# Patient Record
Sex: Female | Born: 2000 | Race: White | Hispanic: No | Marital: Single
Health system: Southern US, Community
[De-identification: ages and names within clinical notes are randomized; demographics above are authoritative.]

---
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
## 2022-02-07 IMAGING — DX DG KNEE COMPLETE 4+V*L*
5 series · 5 of 5 positions shown · non-contrast
Comparison: None.

CLINICAL DATA: Pt presents with left knee pain. States during
lacrosse game yesterday felt pain and now unable to put much
pressure on it to walk

EXAM:
LEFT KNEE - COMPLETE 4+ VIEW

[knee ap]
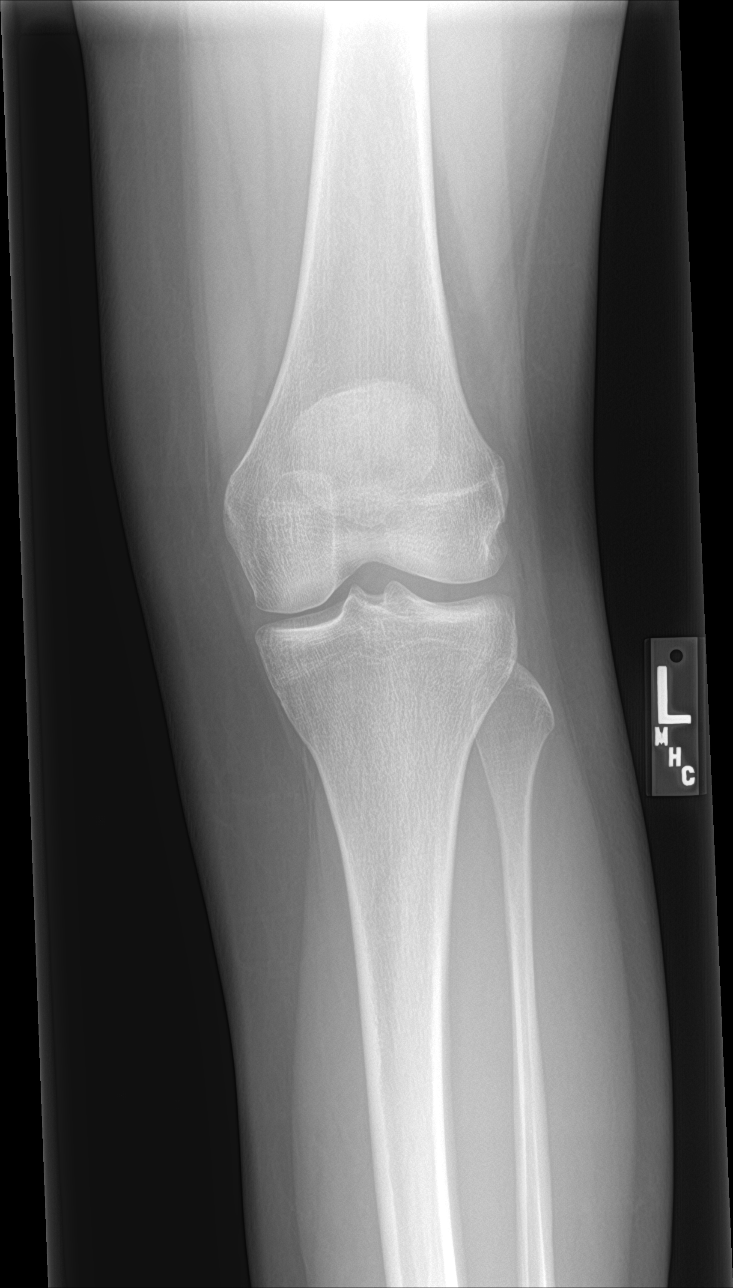

[knee obl (1 of 2)]
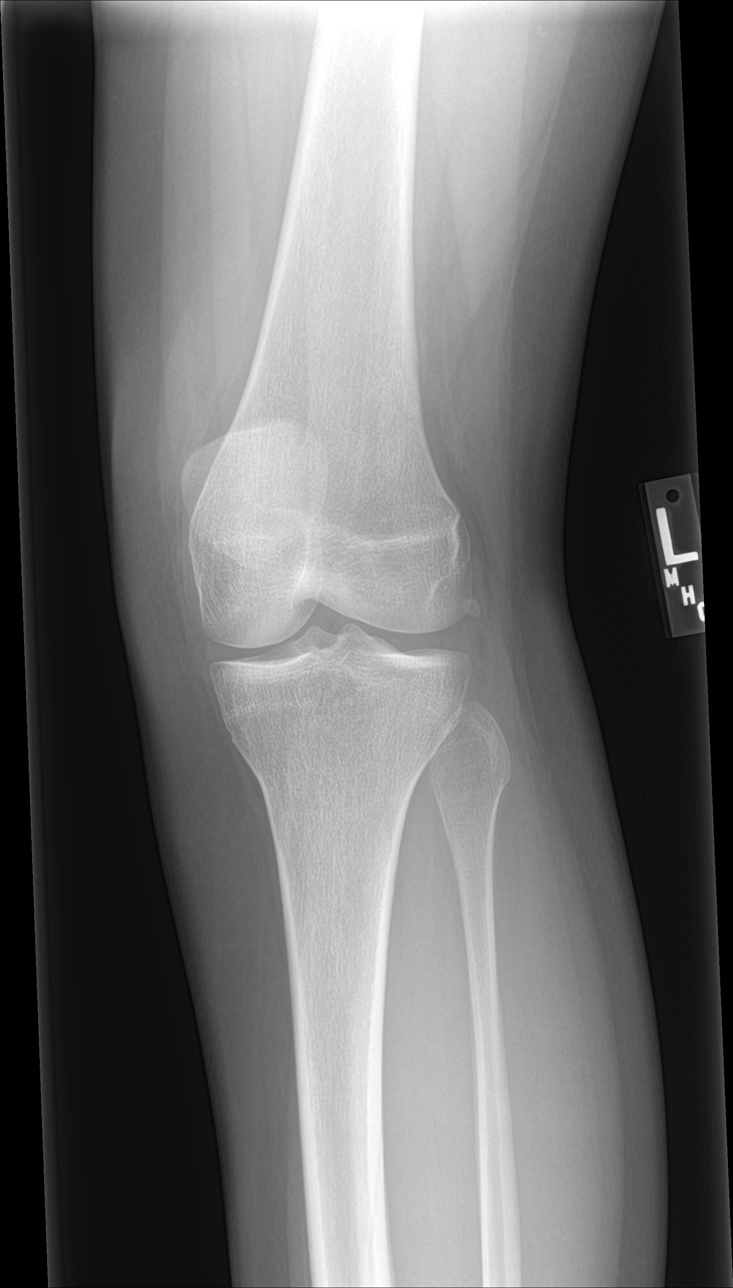

[knee obl (2 of 2)]
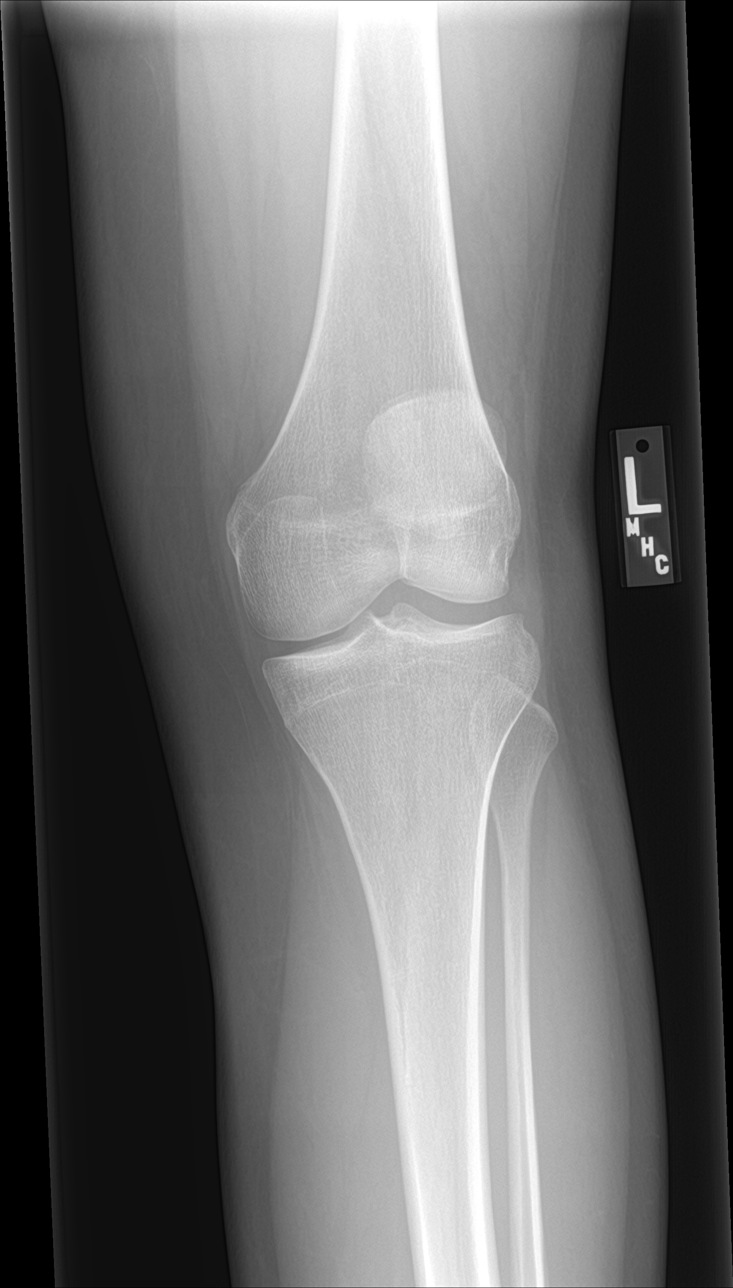

[knee lat]
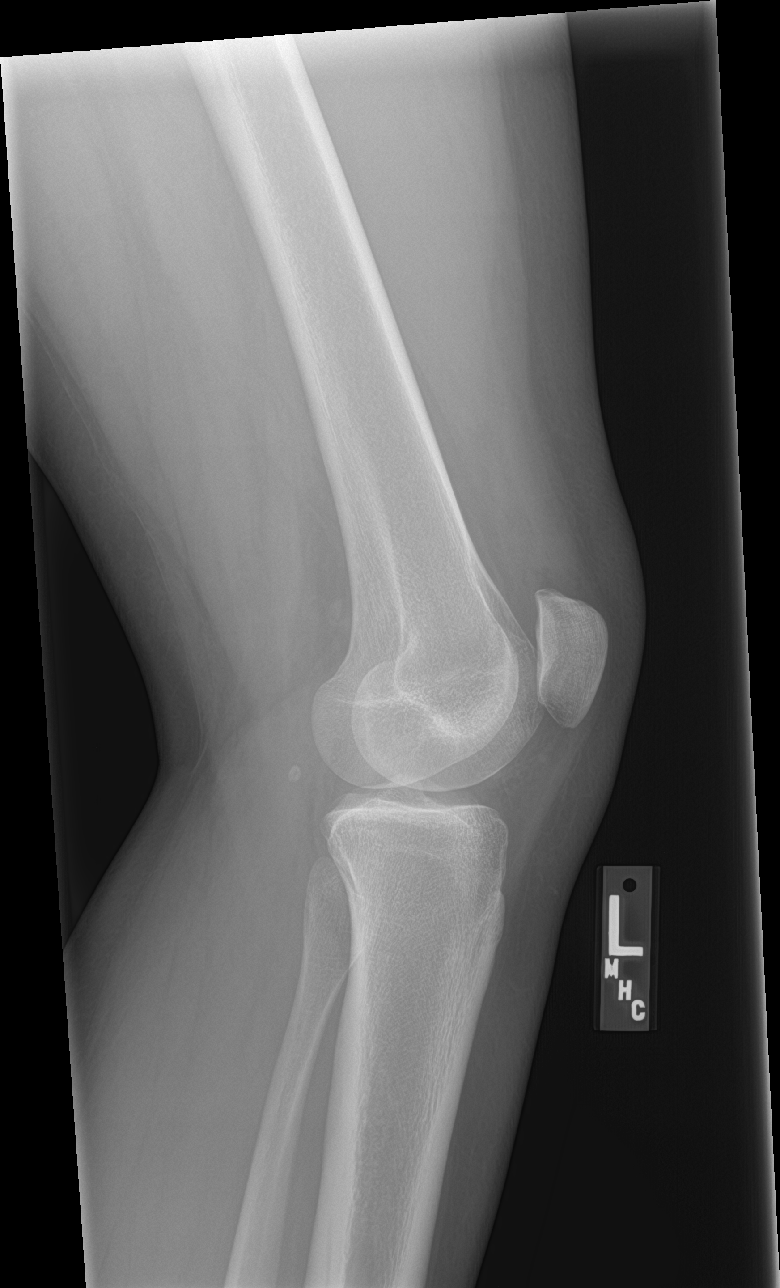

[knee sunrise]
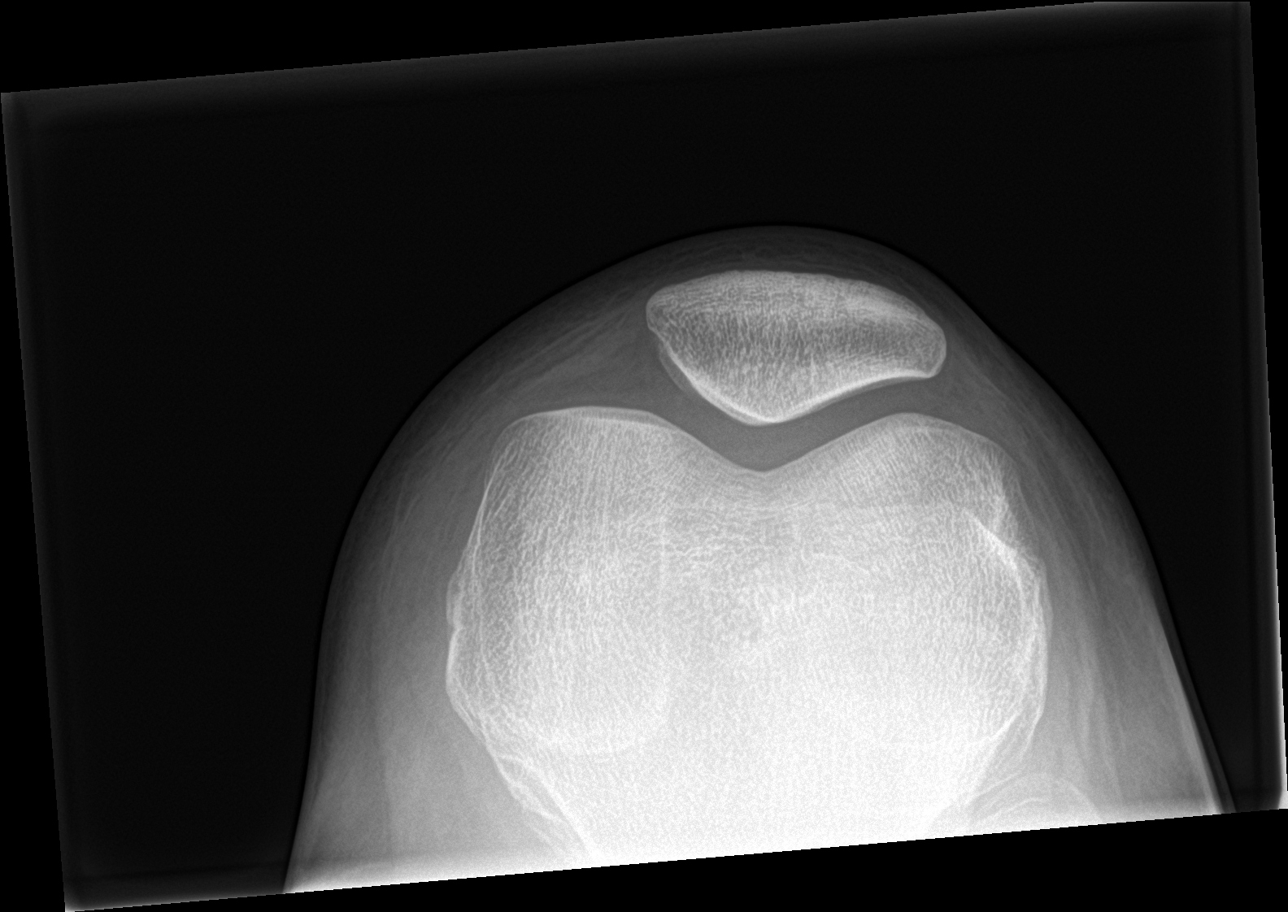

[5 of 5 positions shown; findings below may reference images not displayed]

FINDINGS: No evidence of fracture, dislocation, or joint effusion. No evidence
of arthropathy or other focal bone abnormality. Soft tissues are
unremarkable.
IMPRESSION: Negative.
